# Patient Record
Sex: Female | Born: 1935 | Race: White | Hispanic: No | Smoking: Former smoker
Health system: Southern US, Community
[De-identification: ages and names within clinical notes are randomized; demographics above are authoritative.]

## PROBLEM LIST (undated history)

## (undated) DIAGNOSIS — D649 Anemia, unspecified: Secondary | ICD-10-CM

## (undated) DIAGNOSIS — T7840XA Allergy, unspecified, initial encounter: Secondary | ICD-10-CM

## (undated) DIAGNOSIS — M810 Age-related osteoporosis without current pathological fracture: Secondary | ICD-10-CM

## (undated) DIAGNOSIS — C801 Malignant (primary) neoplasm, unspecified: Secondary | ICD-10-CM

## (undated) DIAGNOSIS — F419 Anxiety disorder, unspecified: Secondary | ICD-10-CM

## (undated) DIAGNOSIS — J45909 Unspecified asthma, uncomplicated: Secondary | ICD-10-CM

## (undated) DIAGNOSIS — M1990 Unspecified osteoarthritis, unspecified site: Secondary | ICD-10-CM

## (undated) DIAGNOSIS — K219 Gastro-esophageal reflux disease without esophagitis: Secondary | ICD-10-CM

## (undated) HISTORY — PX: BREAST SURGERY: SHX581

## (undated) HISTORY — DX: Age-related osteoporosis without current pathological fracture: M81.0

## (undated) HISTORY — DX: Unspecified asthma, uncomplicated: J45.909

## (undated) HISTORY — PX: ABDOMINAL HYSTERECTOMY: SHX81

## (undated) HISTORY — DX: Gastro-esophageal reflux disease without esophagitis: K21.9

## (undated) HISTORY — DX: Allergy, unspecified, initial encounter: T78.40XA

## (undated) HISTORY — DX: Anemia, unspecified: D64.9

## (undated) HISTORY — DX: Anxiety disorder, unspecified: F41.9

## (undated) HISTORY — DX: Malignant (primary) neoplasm, unspecified: C80.1

---

## 2008-05-27 ENCOUNTER — Ambulatory Visit: Payer: Self-pay | Admitting: Radiology

## 2008-05-27 ENCOUNTER — Ambulatory Visit (HOSPITAL_BASED_OUTPATIENT_CLINIC_OR_DEPARTMENT_OTHER): Admission: RE | Admit: 2008-05-27 | Discharge: 2008-05-27 | Payer: Self-pay | Admitting: Family Medicine

## 2008-10-14 ENCOUNTER — Ambulatory Visit: Payer: Self-pay | Admitting: Diagnostic Radiology

## 2008-10-14 ENCOUNTER — Ambulatory Visit (HOSPITAL_BASED_OUTPATIENT_CLINIC_OR_DEPARTMENT_OTHER): Admission: RE | Admit: 2008-10-14 | Discharge: 2008-10-14 | Payer: Self-pay | Admitting: Internal Medicine

## 2008-12-03 ENCOUNTER — Encounter: Admission: RE | Admit: 2008-12-03 | Discharge: 2009-02-22 | Payer: Self-pay | Admitting: Orthopedic Surgery

## 2010-04-19 ENCOUNTER — Ambulatory Visit (HOSPITAL_BASED_OUTPATIENT_CLINIC_OR_DEPARTMENT_OTHER)
Admission: RE | Admit: 2010-04-19 | Discharge: 2010-04-19 | Payer: Self-pay | Source: Home / Self Care | Attending: Family Medicine | Admitting: Family Medicine

## 2013-01-10 ENCOUNTER — Emergency Department (HOSPITAL_BASED_OUTPATIENT_CLINIC_OR_DEPARTMENT_OTHER)
Admission: EM | Admit: 2013-01-10 | Discharge: 2013-01-10 | Disposition: A | Discharge status: Home / Self Care | Payer: Medicare Other | Attending: Emergency Medicine | Admitting: Emergency Medicine

## 2013-01-10 ENCOUNTER — Encounter (HOSPITAL_BASED_OUTPATIENT_CLINIC_OR_DEPARTMENT_OTHER): Payer: Self-pay | Admitting: Emergency Medicine

## 2013-01-10 ENCOUNTER — Emergency Department (HOSPITAL_BASED_OUTPATIENT_CLINIC_OR_DEPARTMENT_OTHER): Payer: Medicare Other

## 2013-01-10 DIAGNOSIS — Z79899 Other long term (current) drug therapy: Secondary | ICD-10-CM | POA: Insufficient documentation

## 2013-01-10 DIAGNOSIS — R52 Pain, unspecified: Secondary | ICD-10-CM | POA: Insufficient documentation

## 2013-01-10 DIAGNOSIS — M129 Arthropathy, unspecified: Secondary | ICD-10-CM | POA: Insufficient documentation

## 2013-01-10 DIAGNOSIS — Z87891 Personal history of nicotine dependence: Secondary | ICD-10-CM | POA: Insufficient documentation

## 2013-01-10 DIAGNOSIS — R0602 Shortness of breath: Secondary | ICD-10-CM | POA: Insufficient documentation

## 2013-01-10 DIAGNOSIS — IMO0002 Reserved for concepts with insufficient information to code with codable children: Secondary | ICD-10-CM | POA: Insufficient documentation

## 2013-01-10 DIAGNOSIS — R110 Nausea: Secondary | ICD-10-CM | POA: Insufficient documentation

## 2013-01-10 DIAGNOSIS — Z880 Allergy status to penicillin: Secondary | ICD-10-CM | POA: Insufficient documentation

## 2013-01-10 DIAGNOSIS — M25512 Pain in left shoulder: Secondary | ICD-10-CM

## 2013-01-10 DIAGNOSIS — M25519 Pain in unspecified shoulder: Secondary | ICD-10-CM | POA: Insufficient documentation

## 2013-01-10 HISTORY — DX: Unspecified osteoarthritis, unspecified site: M19.90

## 2013-01-10 MED ORDER — CYCLOBENZAPRINE HCL 10 MG PO TABS
5.0000 mg | ORAL_TABLET | Freq: Once | ORAL | Status: AC
  Administered 2013-01-10: 5 mg via ORAL
  Filled 2013-01-10: qty 1

## 2013-01-10 MED ORDER — CYCLOBENZAPRINE HCL 10 MG PO TABS: 10.0000 mg | ORAL_TABLET | Freq: Two times a day (BID) | ORAL | Status: DC | PRN

## 2013-01-10 MED ORDER — PREDNISONE 10 MG PO TABS: 20.0000 mg | ORAL_TABLET | Freq: Every day | ORAL | Status: DC

## 2013-01-10 NOTE — ED Provider Notes (Addendum)
CSN: [id number]     Arrival date & time 01/10/13  0825 History   First MD Initiated Contact with Patient 01/10/13 [phone]     Chief Complaint  Patient presents with  . Shoulder Pain  . Shortness of Breath  . Nausea   (Consider location/radiation/quality/duration/timing/severity/associated sxs/prior Treatment) HPI Comments: Patient with history of bilateral shoulder problems with rotator cuff, no recent injury or unusual activity.  Patient denies dyspnea or nausea to me.  She states like similar shoulder problems. No immunocompromise risk factors.  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  No neck pain, numbness or weakness,   Patient is a 79 y.o. female presenting with shoulder pain and shortness of breath. The history is provided by the patient.  Shoulder Pain This is a recurrent problem. The current episode started yesterday. The problem occurs constantly. The problem has not changed since onset.Pertinent negatives include no chest pain, no abdominal pain, no headaches and no shortness of breath. Exacerbated by: Movement and palpaition. Nothing relieves the symptoms. She has tried nothing for the symptoms. The treatment provided mild relief.  Shortness of Breath Associated symptoms: no abdominal pain, no chest pain and no headaches     Past Medical History  Diagnosis Date  . Arthritis    Past Surgical History  Procedure Laterality Date  . Abdominal hysterectomy     History reviewed. No pertinent family history. History  Substance Use Topics  . Smoking status: Former Games developer  . Smokeless tobacco: Not on file  . Alcohol Use: Yes     Comment: wine occ a week   OB History   Grav Para Term Preterm Abortions TAB SAB Ect Mult Living                 Review of Systems  Respiratory: Negative for shortness of breath.   Cardiovascular: Negative for chest pain.  Gastrointestinal: Negative for abdominal pain.  Neurological: Negative for headaches.    Allergies  Penicillins  Home  Medications   Current Outpatient Rx  Name  Route  Sig  Dispense  Refill  . naproxen (NAPROSYN) 500 MG tablet   Oral   Take 500 mg by mouth 2 (two) times daily with a meal.         . predniSONE (STERAPRED UNI-PAK) 10 MG tablet   Oral   Take 10 mg by mouth daily.          BP 144/90  Pulse 86  Temp(Src) 97.8 F (36.6 C) (Oral)  SpO2 97% Physical Exam  Nursing note and vitals reviewed. Constitutional: She is oriented to person, place, and time. She appears well-developed and well-nourished.  HENT:  Head: Normocephalic and atraumatic.  Right Ear: External ear normal.  Left Ear: External ear normal.  Mouth/Throat: Oropharynx is clear and moist.  Eyes: EOM are normal. Pupils are equal, round, and reactive to light.  Neck: Normal range of motion. Neck supple.  Cardiovascular: Normal rate, regular rhythm and normal heart sounds.   Pulmonary/Chest: Effort normal and breath sounds normal.  Abdominal: Soft.  Musculoskeletal:  Patient with anterior left shoulder pain with pain increased with abduction and external rotation.  No pain at elbow or neck.  No redness or warmth palpated.  Neurovascularly intact distal to injury. Left trapezius 5/5, intrinsic hand muscle strength 5/5, left elbow flexor/exte strength 5/5/. Biceps refelx 2/3.   Neurological: She is alert and oriented to person, place, and time.  Skin: Skin is warm and dry.  Psychiatric: She has a normal mood  and affect. Her behavior is normal. Judgment and thought content normal.    ED Course  Procedures (including critical care time) Labs Review Labs Reviewed - No data to display Imaging Review Dg Chest 2 View  01/10/2013   CLINICAL DATA:  Nausea and short of breath.  EXAM: CHEST  2 VIEW  COMPARISON:  None.  FINDINGS: Mild basilar atelectasis. No airspace disease. Thoracic spine DISH. The cardiopericardial silhouette is within normal limits for projection. Mediastinal contours are within normal limits as well.   IMPRESSION: No acute cardiopulmonary disease. Mild basilar atelectasis.   Electronically Signed   By: Andreas Newport M.D.   On: 01/10/2013 09:28   Dg Shoulder Left  01/10/2013   CLINICAL DATA:  Left shoulder pain since yesterday. No injury.  EXAM: LEFT SHOULDER - 2+ VIEW  COMPARISON:  None.  FINDINGS: There is partially visualized cervical spondylosis. Mild to moderate AC joint osteoarthritis is present with hypertrophy of the distal clavicle. Mild glenohumeral osteoarthritis is present with marginal osteophytes at the inferior glenohumeral joint. The shoulder is located on the scapular Y-view and on the axillary view. There is no fracture. Acromion is type 2. The visualized left chest appears within normal limits.  IMPRESSION: Mild glenohumeral and mild to moderate AC joint osteoarthritis.   Electronically Signed   By: Andreas Newport M.D.   On: 01/10/2013 09:25     Date: 01/14/2013  Rate: 76  Rhythm: normal sinus rhythm  QRS Axis: normal  Intervals: normal  ST/T Wave abnormalities: normal  Conduction Disutrbances:none  Narrative Interpretation: poor r wave progression, cannot rule out previous infarct  Old EKG Reviewed: none available    MDM  Left shoulder pain that appears localized to shoulder with reproducibiity on exam.  I do not suspect any cardiac etiology based on hsitory and exam despite nn mention of dyspnea.  EKG obtained pte shows no acute changes.  Patient is given nsaids muscle relaxants, and sling and advised close folloe up.   Hilario Quarry, MD 01/10/13 1001  Hilario Quarry, MD 01/14/13 [phone]

## 2013-01-10 NOTE — ED Notes (Signed)
Pt reports that this pain started yesterday with rad to left side of her neck.  Pt denies any recent trauma and reports nausea this AM.

## 2014-06-23 IMAGING — CR DG CHEST 2V
2 series · 2 of 2 positions shown · non-contrast
Comparison: None.

CLINICAL DATA: Nausea and short of breath.

EXAM:
CHEST  2 VIEW

[w chest pa]
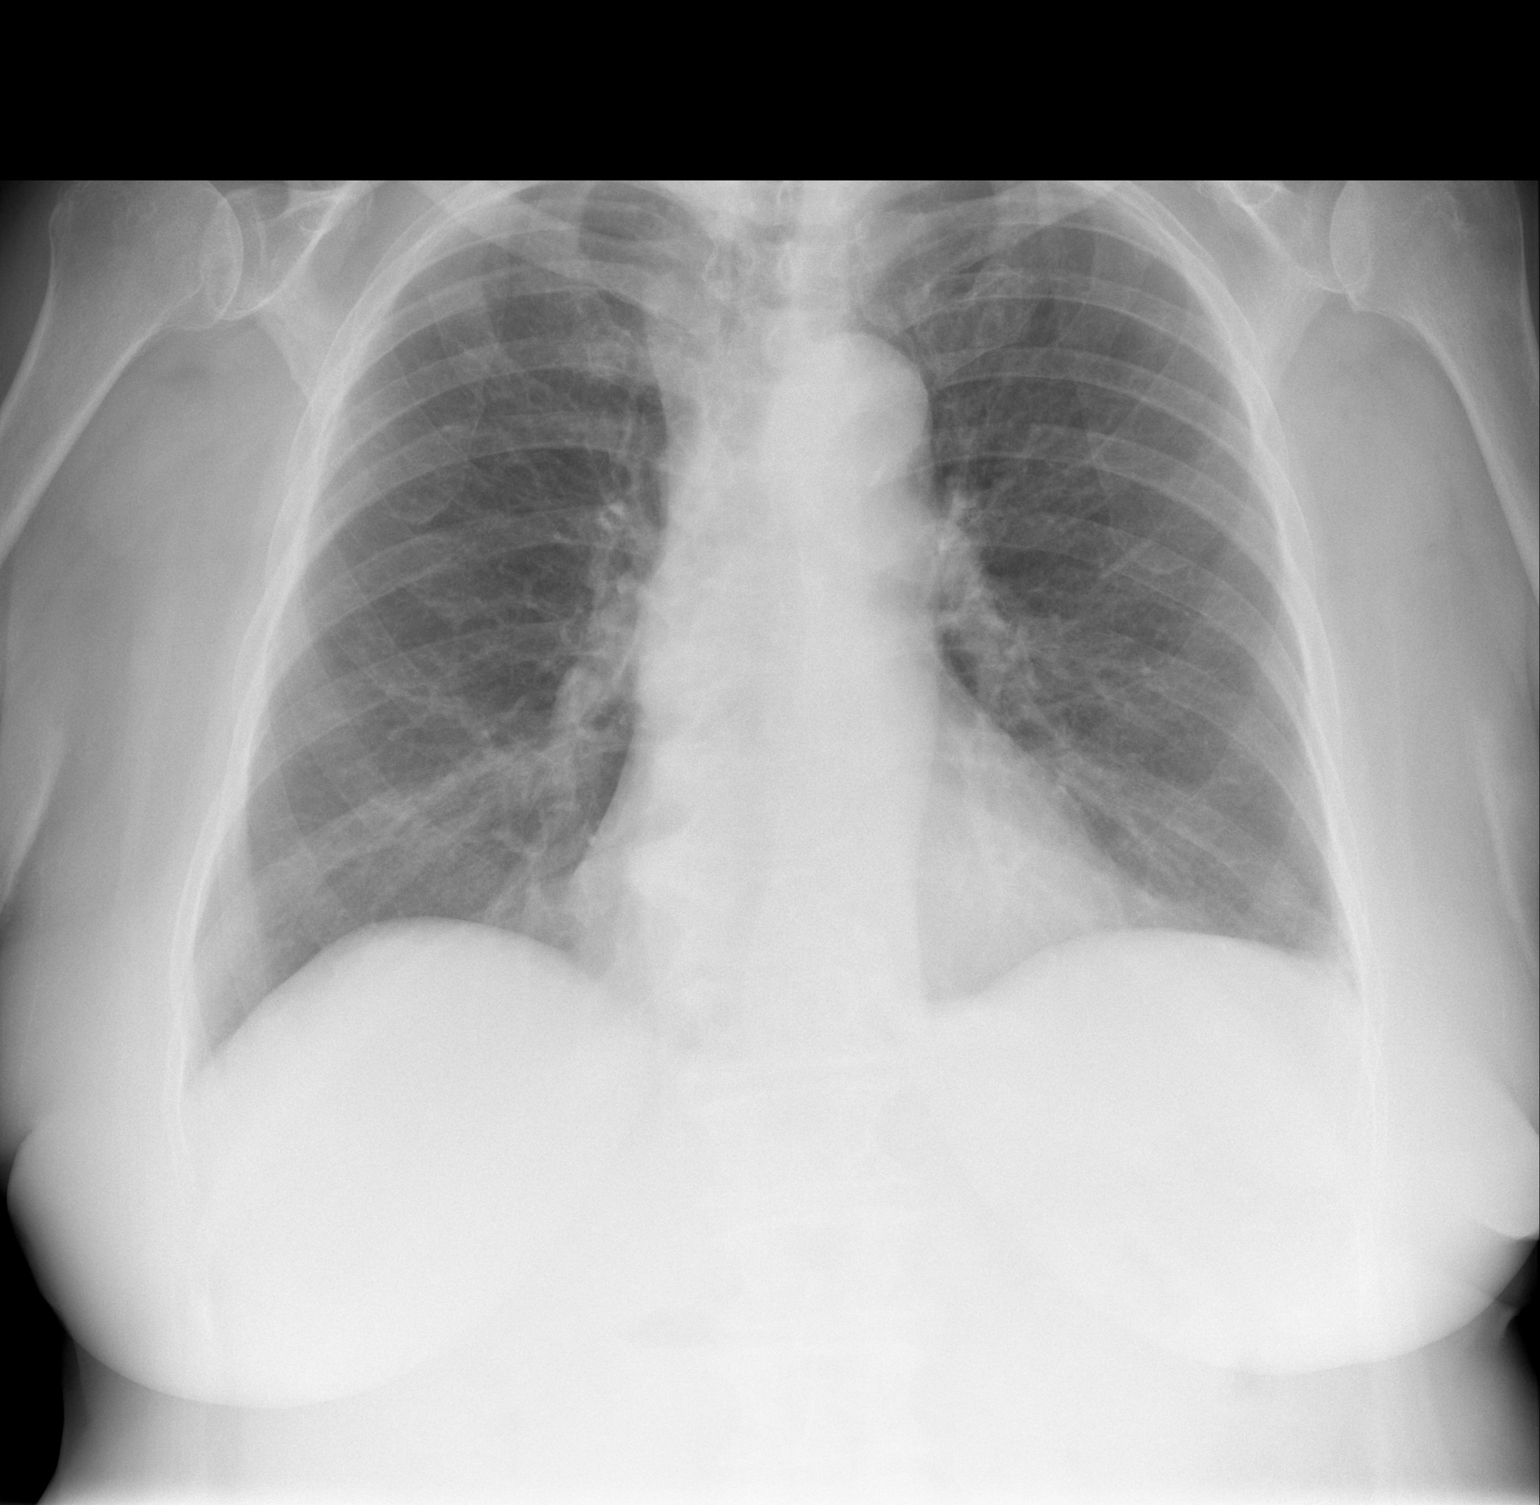

[w chest lat]
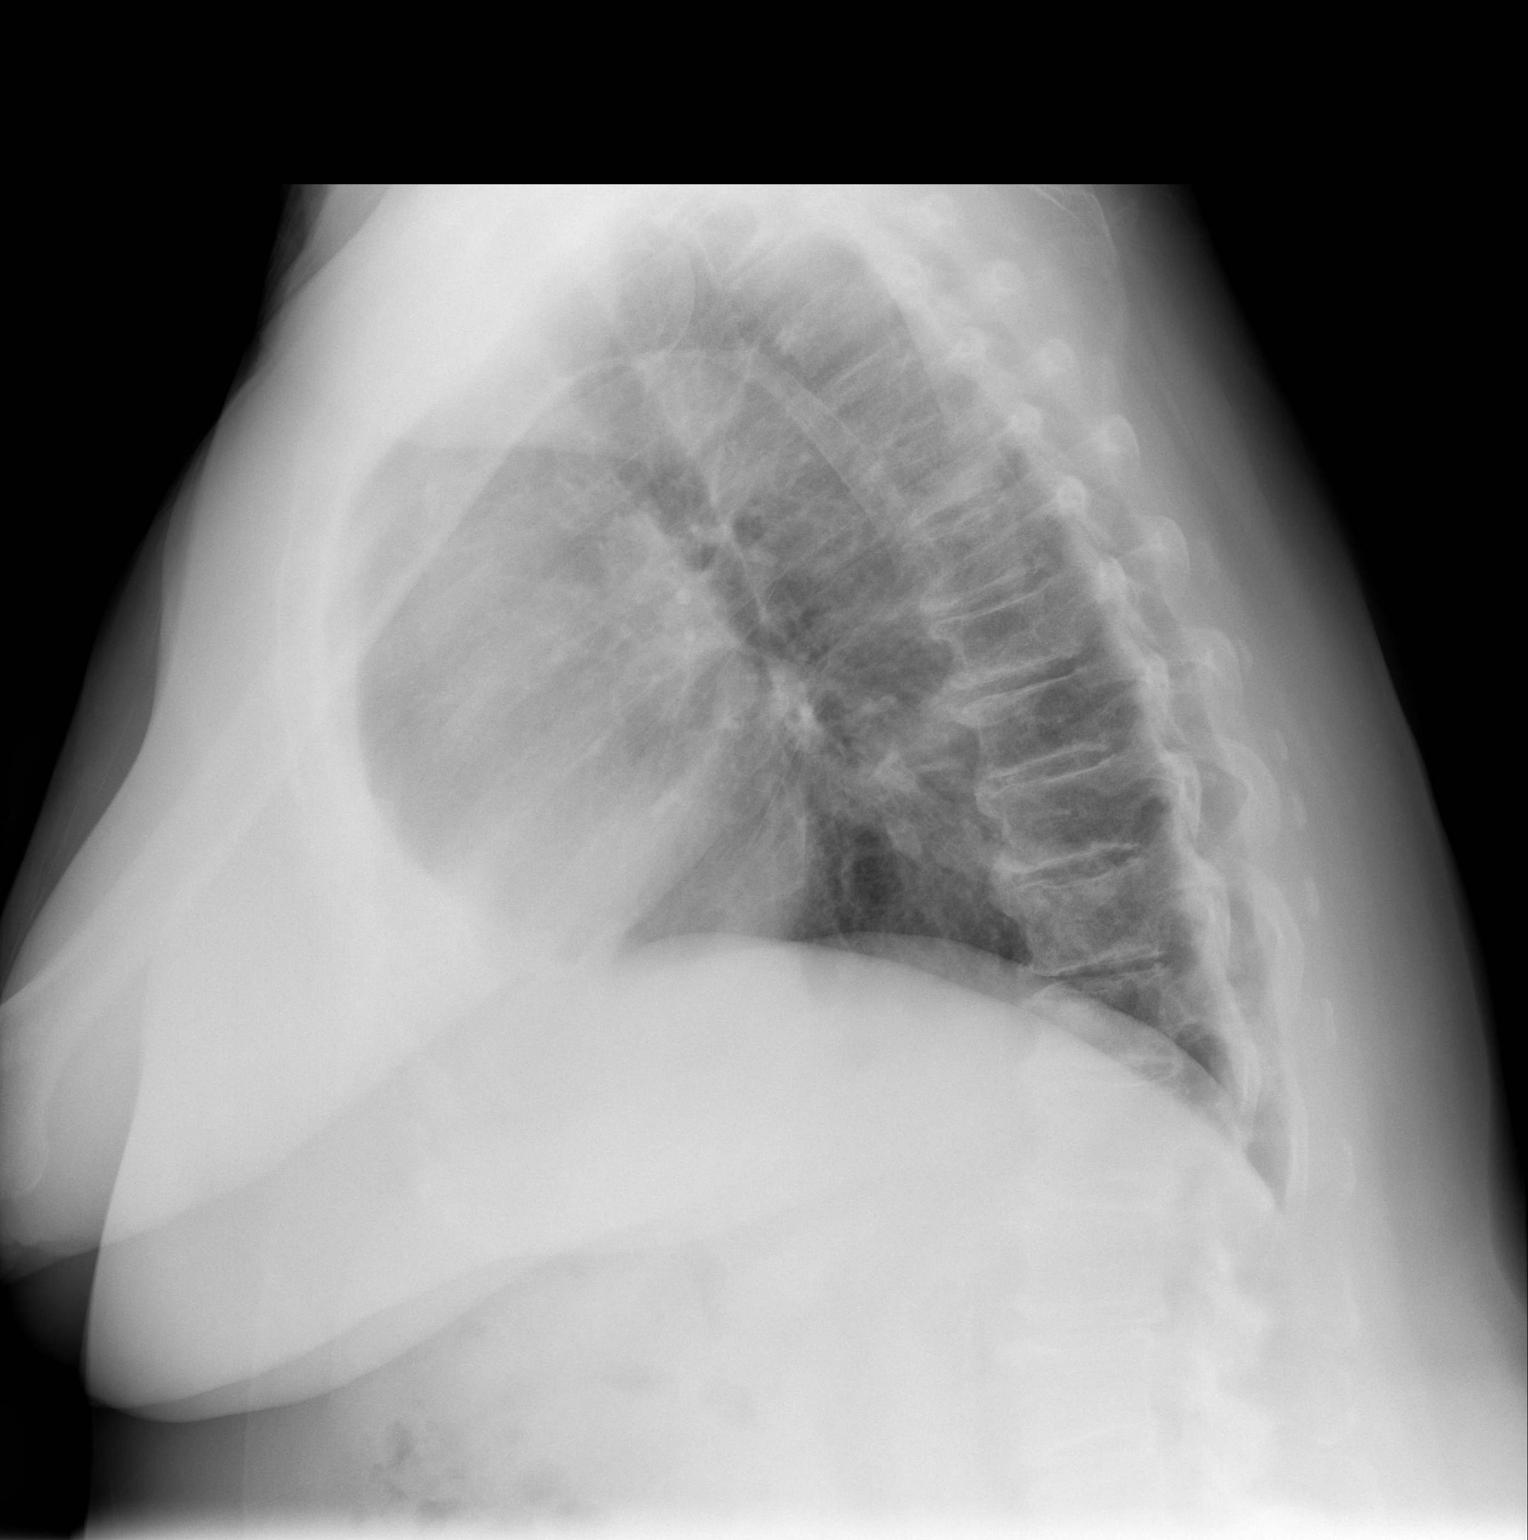

[2 of 2 positions shown; findings below may reference images not displayed]

FINDINGS: Mild basilar atelectasis. No airspace disease. Thoracic spine DISH.
The cardiopericardial silhouette is within normal limits for
projection. Mediastinal contours are within normal limits as well.
IMPRESSION: No acute cardiopulmonary disease. Mild basilar atelectasis.

## 2014-06-23 IMAGING — CR DG SHOULDER 2+V*L*
3 series · 3 of 3 positions shown · non-contrast
Comparison: None.

CLINICAL DATA: Left shoulder pain since yesterday. No injury.

EXAM:
LEFT SHOULDER - 2+ VIEW

[w shoulder ap internal left]
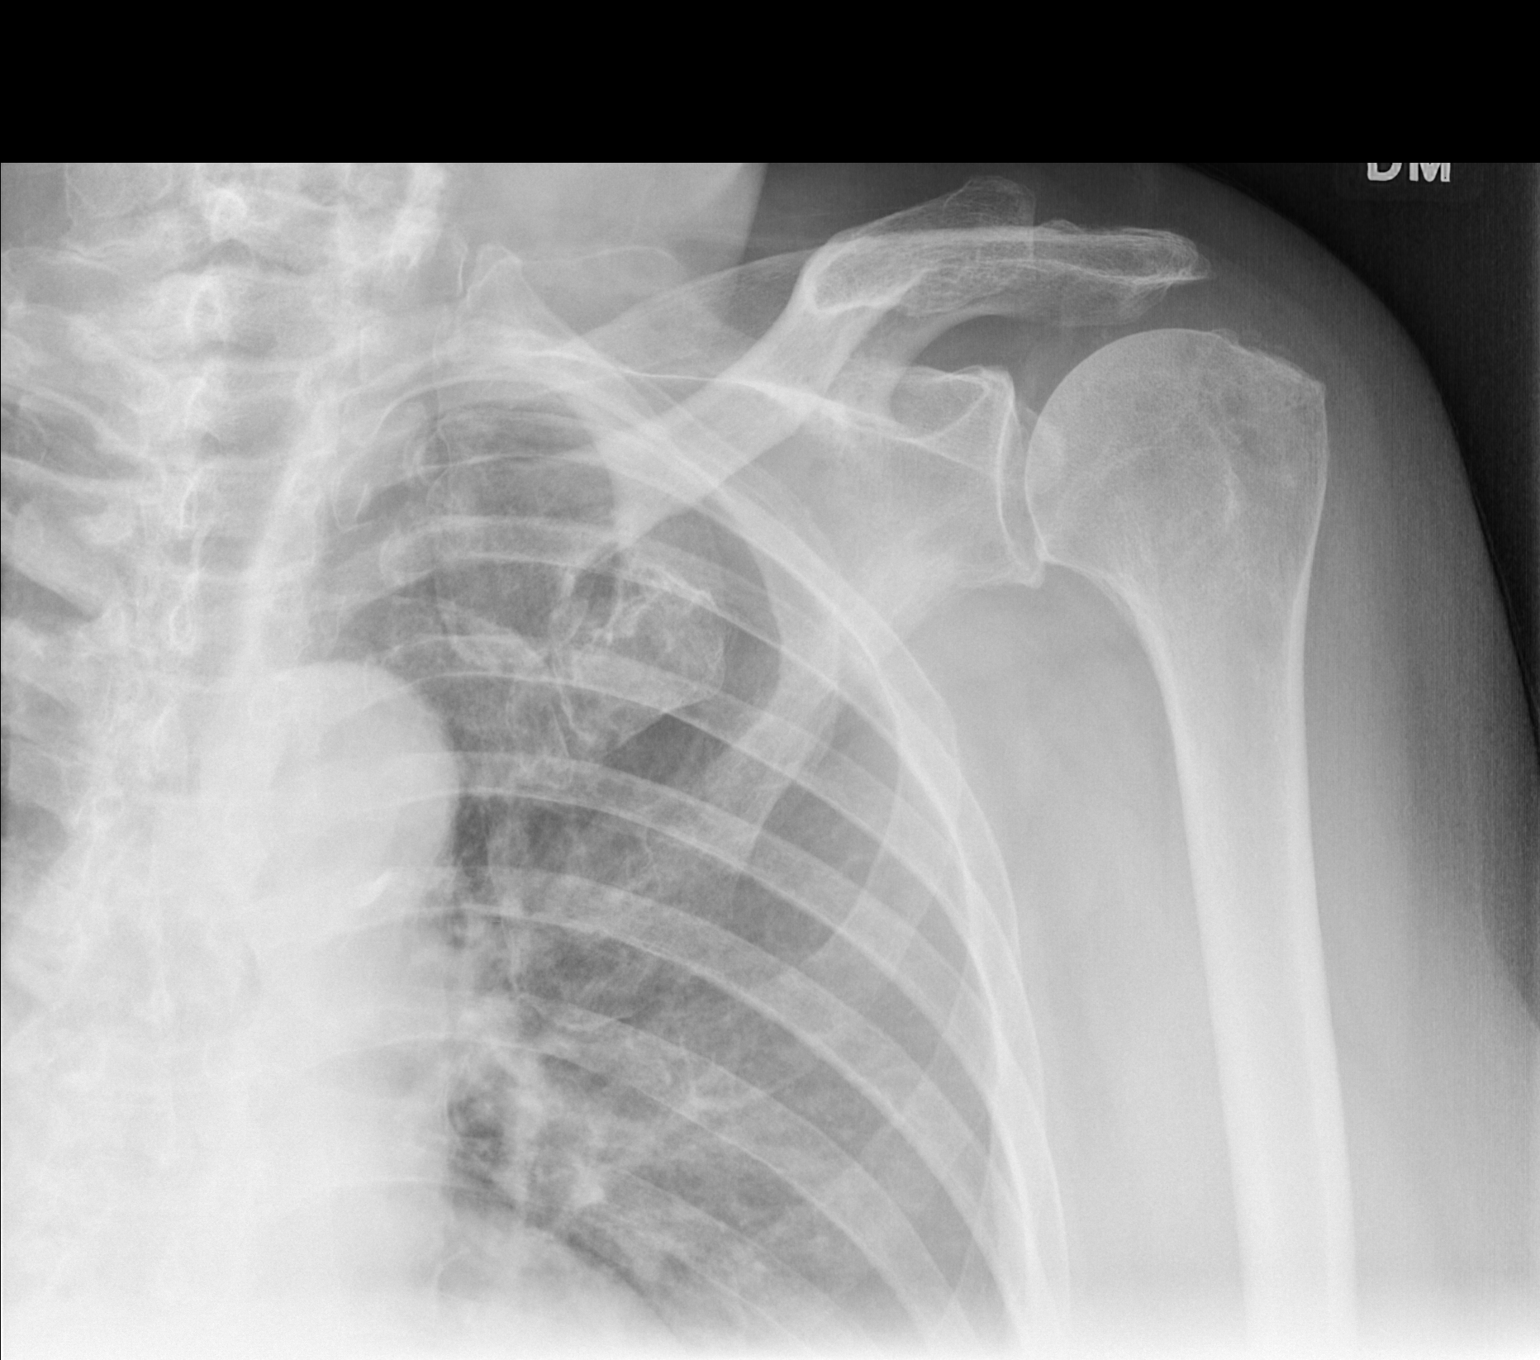

[w shoulder y view left]
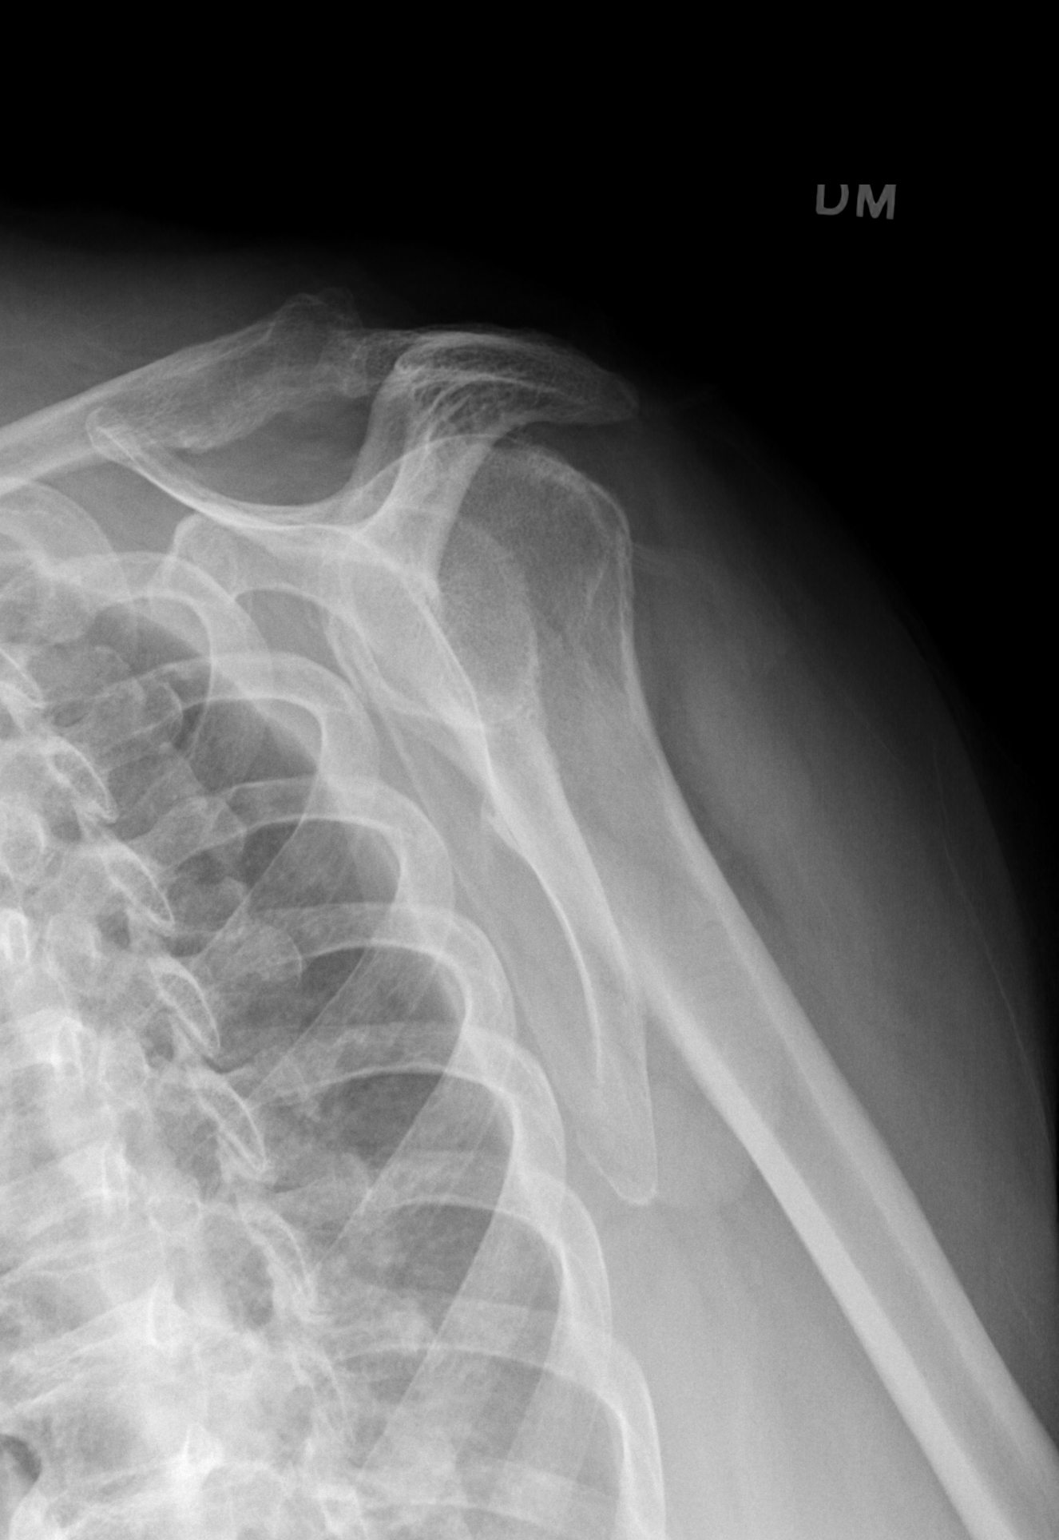

[x shoulder axillary left]
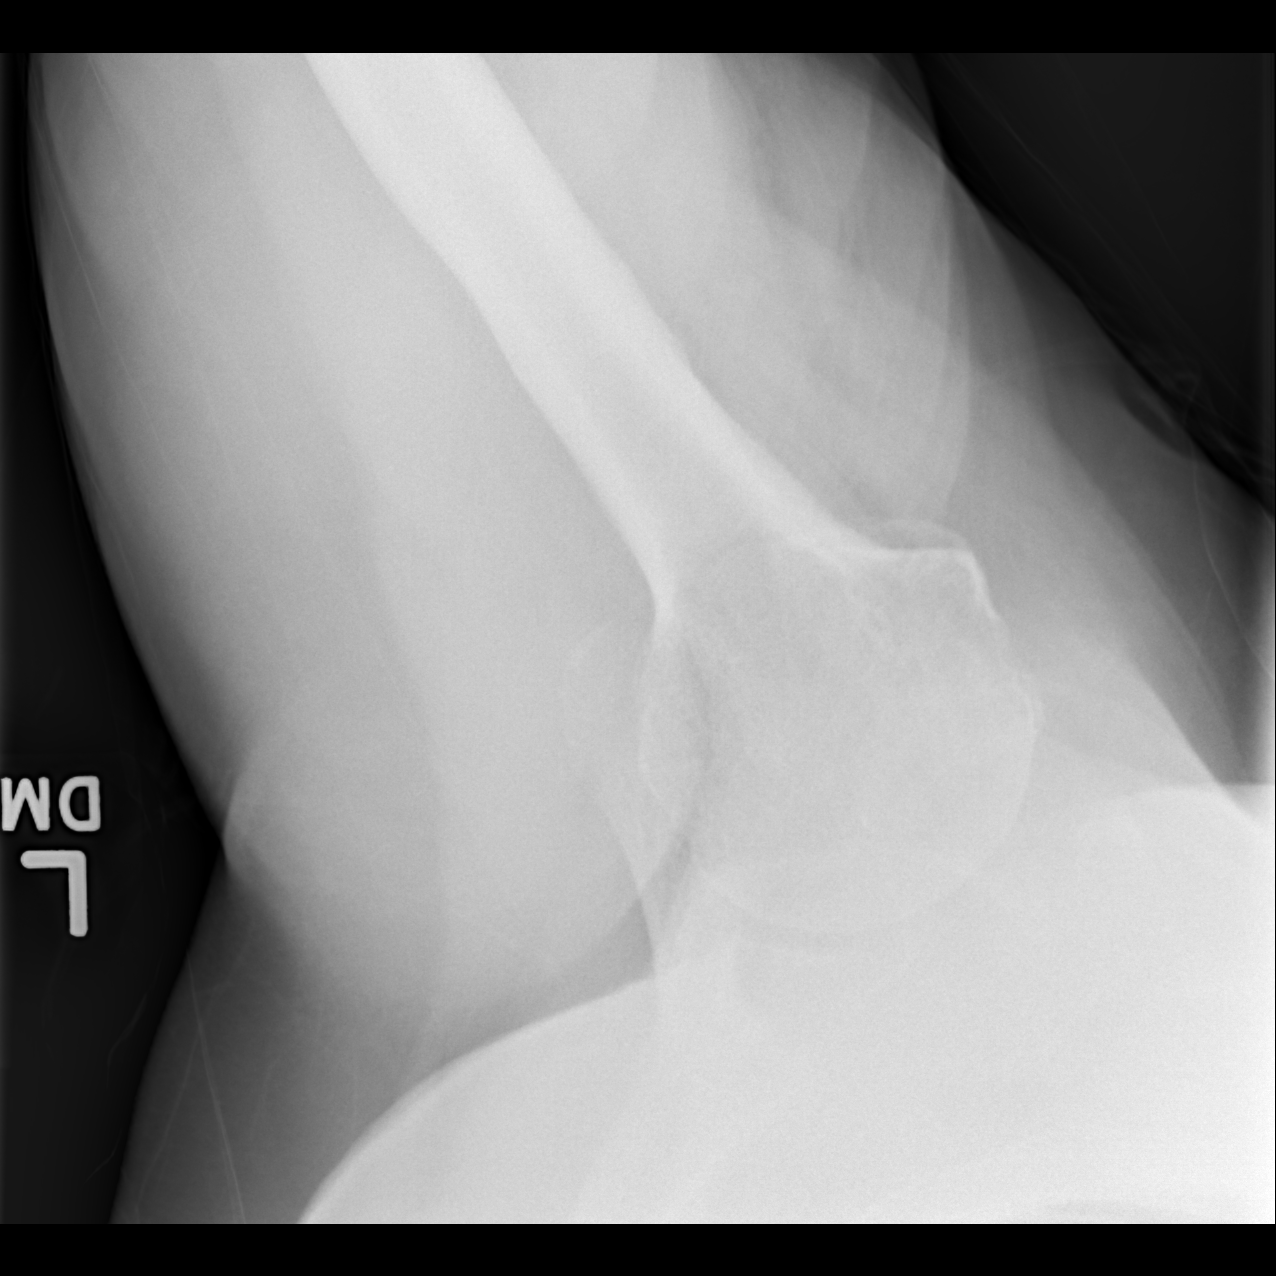

[3 of 3 positions shown; findings below may reference images not displayed]

FINDINGS: There is partially visualized cervical spondylosis. Mild to moderate
AC joint osteoarthritis is present with hypertrophy of the distal
clavicle. Mild glenohumeral osteoarthritis is present with marginal
osteophytes at the inferior glenohumeral joint. The shoulder is
located on the scapular Y-view and on the axillary view. There is no
fracture. Acromion is type 2. The visualized left chest appears
within normal limits.
IMPRESSION: Mild glenohumeral and mild to moderate AC joint osteoarthritis.

## 2021-09-13 ENCOUNTER — Encounter: Payer: Self-pay | Admitting: Family Medicine

## 2021-09-13 ENCOUNTER — Ambulatory Visit (INDEPENDENT_AMBULATORY_CARE_PROVIDER_SITE_OTHER): Payer: Medicare Other | Admitting: Family Medicine

## 2021-09-13 VITALS — BP 120/59 | HR 77 | Ht 62.0 in | Wt 150.0 lb

## 2021-09-13 DIAGNOSIS — Z79899 Other long term (current) drug therapy: Secondary | ICD-10-CM

## 2021-09-13 DIAGNOSIS — K047 Periapical abscess without sinus: Secondary | ICD-10-CM

## 2021-09-13 DIAGNOSIS — R7989 Other specified abnormal findings of blood chemistry: Secondary | ICD-10-CM

## 2021-09-13 DIAGNOSIS — R600 Localized edema: Secondary | ICD-10-CM | POA: Diagnosis not present

## 2021-09-13 DIAGNOSIS — I10 Essential (primary) hypertension: Secondary | ICD-10-CM | POA: Diagnosis not present

## 2021-09-13 DIAGNOSIS — E559 Vitamin D deficiency, unspecified: Secondary | ICD-10-CM | POA: Diagnosis not present

## 2021-09-13 DIAGNOSIS — E538 Deficiency of other specified B group vitamins: Secondary | ICD-10-CM

## 2021-09-13 LAB — COMPREHENSIVE METABOLIC PANEL
ALT: 12 U/L (ref 0–35)
AST: 20 U/L (ref 0–37)
Albumin: 3.9 g/dL (ref 3.5–5.2)
Alkaline Phosphatase: 63 U/L (ref 39–117)
BUN: 18 mg/dL (ref 6–23)
CO2: 28 mEq/L (ref 19–32)
Calcium: 10 mg/dL (ref 8.4–10.5)
Chloride: 99 mEq/L (ref 96–112)
Creatinine, Ser: 0.83 mg/dL (ref 0.40–1.20)
GFR: 62.83 mL/min (ref >60.00)
Glucose, Bld: 82 mg/dL (ref 70–99)
Potassium: 4.8 mEq/L (ref 3.5–5.1)
Sodium: 135 mEq/L (ref 135–145)
Total Bilirubin: 0.4 mg/dL (ref 0.2–1.2)
Total Protein: 6.6 g/dL (ref 6.0–8.3)

## 2021-09-13 LAB — LIPID PANEL
Cholesterol: 167 mg/dL (ref 0–200)
HDL: 74.9 mg/dL (ref >39.00)
LDL Cholesterol: 78 mg/dL (ref 0–99)
NonHDL: 92.42
Total CHOL/HDL Ratio: 2
Triglycerides: 72 mg/dL (ref 0.0–149.0)
VLDL: 14.4 mg/dL (ref 0.0–40.0)

## 2021-09-13 LAB — VITAMIN D 25 HYDROXY (VIT D DEFICIENCY, FRACTURES): VITD: 19.09 ng/mL — ABNORMAL LOW (ref 30.00–100.00)

## 2021-09-13 LAB — BRAIN NATRIURETIC PEPTIDE: Pro B Natriuretic peptide (BNP): 300 pg/mL — ABNORMAL HIGH (ref 0.0–100.0)

## 2021-09-13 LAB — CBC
HCT: 34.8 % — ABNORMAL LOW (ref 36.0–46.0)
Hemoglobin: 11.6 g/dL — ABNORMAL LOW (ref 12.0–15.0)
MCHC: 33.2 g/dL (ref 30.0–36.0)
MCV: 97.7 fl (ref 78.0–100.0)
Platelets: 303 10*3/uL (ref 150.0–400.0)
RBC: 3.56 Mil/uL — ABNORMAL LOW (ref 3.87–5.11)
RDW: 14.2 % (ref 11.5–15.5)
WBC: 5.5 10*3/uL (ref 4.0–10.5)

## 2021-09-13 LAB — VITAMIN B12: Vitamin B-12: 242 pg/mL (ref 211–911)

## 2021-09-13 LAB — TSH: TSH: 3.2 u[IU]/mL (ref 0.35–5.50)

## 2021-09-13 MED ORDER — LISINOPRIL 5 MG PO TABS: 5.0000 mg | ORAL_TABLET | Freq: Every day | ORAL | 1 refills | Status: DC

## 2021-09-13 MED ORDER — CLINDAMYCIN HCL 300 MG PO CAPS: 300.0000 mg | ORAL_CAPSULE | Freq: Three times a day (TID) | ORAL | 0 refills | Status: AC

## 2021-09-13 NOTE — Progress Notes (Addendum)
New Patient Office Visit  Subjective    Patient ID: Michele Browning, female    DOB: 07/20/1930  Age: 88 y.o. MRN: [id number]  CC: establish care  HPI Michele Browning presents to establish care. She changed PCPs d/t location preference.    Chronic lower extremity edema: Patient reports she has had bilateral lower leg swelling for the past 4+ years.  She states it was most noticeable after hip surgery.  She reports they will occasionally get red at times and she does tend to scratch/pick at them so she will have some occasional scabbing and areas.  She denies any rashes, warmth, weeping, drainage.  Per daughter she has a very inactive lifestyle, only walking maybe 20-30 steps per day as she lives in a small in-law suite attached to daughter's house.  She has not been elevating her legs and has refused wearing compression socks in the past. Patient denies any chest pain, palpitations, dyspnea, wheezing, edema, recurrent headaches, vision changes.   Dental infection: Patient reports that she has a known cracked tooth to her left upper mouth, but she is put off going to the dentist in a while due to COVID and mobility issues.  States that over the weekend, about 3 days ago, she woke up with left-sided facial swelling, redness, pain, warmth.  Her gums surrounding the cracked tooth have been red and swollen.  She refused to go to urgent care, but her daughter found some leftover clindamycin and they gave her 2 to 3 days worth of that and it did not seem to help quite a bit although the gum itself still looks quite red and is still painful.  They are working on getting her in with a dentist ASAP.  Generalized left-sided facial swelling has gone down significantly and pain is well controlled with Tylenol.  She has not noticed any drainage.   She follows with pain management for Percocet and Lyrica (chronic pain syndrome, OA of multiple joints, lumbar stenosis). This provider needs urine samples, but patient  does not feel comfortable navigating their building (as been doing primarily virtual visits), so they would like to do the urine drug screen here. They have ordered home health PT - very weak, needs complete support with ambulation/transfers (she uses wheelchair at baseline now).   Patient recently had lumpectomy for breast cancer  (L breast) and is about to start oral medications. She follows with oncology. States she will not be doing any radiation. She is due to see them soon.    Low B12 in the past - currently on OTC supplementation. Asymptomatic.    Daughter who cares for her is about to have treatment for multiple myeloma - chemo, prepping for bone marrow transplant. She is having trouble taking care of her now.  Outpatient Encounter Medications as of 09/13/2021  Medication Sig   clindamycin (CLEOCIN) 300 MG capsule Take 1 capsule (300 mg total) by mouth 3 (three) times daily for 7 days.   oxyCODONE-acetaminophen (PERCOCET) 10-325 MG tablet Take by mouth.   pregabalin (LYRICA) 75 MG capsule Take by mouth.   [DISCONTINUED] lisinopril (ZESTRIL) 5 MG tablet Take 1 tablet by mouth daily.   lisinopril (ZESTRIL) 5 MG tablet Take 1 tablet (5 mg total) by mouth daily.   naproxen (NAPROSYN) 500 MG tablet Take 500 mg by mouth 2 (two) times daily with a meal.   [DISCONTINUED] cyclobenzaprine (FLEXERIL) 10 MG tablet Take 1 tablet (10 mg total) by mouth 2 (two) times daily as needed for muscle  spasms.   [DISCONTINUED] predniSONE (DELTASONE) 10 MG tablet Take 2 tablets (20 mg total) by mouth daily.   [DISCONTINUED] predniSONE (STERAPRED UNI-PAK) 10 MG tablet Take 10 mg by mouth daily.   No facility-administered encounter medications on file as of 09/13/2021.    Past Medical History:  Diagnosis Date   Allergy    Anemia    Anxiety    Arthritis    Asthma    Cancer ([place])    GERD (gastroesophageal reflux disease)    Osteoporosis     Past Surgical History:  Procedure Laterality Date    ABDOMINAL HYSTERECTOMY     BREAST SURGERY      Family History  Problem Relation Age of Onset   Cancer Mother    Heart attack Father    Cancer Daughter     Social History   Socioeconomic History   Marital status: Divorced    Spouse name: Not on file   Number of children: Not on file   Years of education: Not on file   Highest education level: Not on file  Occupational History   Not on file  Tobacco Use   Smoking status: Former   Smokeless tobacco: Not on file  Substance and Sexual Activity   Alcohol use: Yes    Alcohol/week: 3.0 standard drinks of alcohol    Types: 3 Glasses of wine per week    Comment: wine occ a week   Drug use: No   Sexual activity: Never  Other Topics Concern   Not on file  Social History Narrative   Not on file   Social Determinants of Health   Financial Resource Strain: Not on file  Food Insecurity: Not on file  Transportation Needs: Not on file  Physical Activity: Not on file  Stress: Not on file  Social Connections: Not on file  Intimate Partner Violence: Not on file    ROS All review of systems negative except what is listed in the HPI      Objective    BP (!) 120/59   Pulse 77   Physical Exam Vitals reviewed.  Constitutional:      Appearance: Normal appearance.  HENT:     Head: Normocephalic and atraumatic.     Mouth/Throat:     Comments: Left upper mouth: cracked/decaying tooth with surrounding gum erythema and inflammation, no drainage noted Cardiovascular:     Rate and Rhythm: Normal rate and regular rhythm.  Pulmonary:     Effort: Pulmonary effort is normal.     Breath sounds: Normal breath sounds.  Musculoskeletal:     Right lower leg: Edema present.     Left lower leg: Edema present.  Skin:    Findings: No rash.  Neurological:     General: No focal deficit present.     Mental Status: She is alert and oriented to person, place, and time. Mental status is at baseline.  Psychiatric:        Mood and Affect:  Mood normal.        Behavior: Behavior normal.        Thought Content: Thought content normal.        Judgment: Judgment normal.         Assessment & Plan:   Problem List Items Addressed This Visit       Cardiovascular and Mediastinum   Primary hypertension    Blood pressure is at goal for age and co-morbidities.  I recommend continuing lisinopril.   - BP goal <130/80 -  monitor and log blood pressures at home - check around the same time each day in a relaxed setting - Limit salt to <2000 mg/day - Follow DASH eating plan (heart healthy diet) - limit alcohol to 2 standard drinks per day for men and 1 per day for women - avoid tobacco products - get at least 2 hours of regular aerobic exercise weekly Patient aware of signs/symptoms requiring further/urgent evaluation. Labs updated today.       Relevant Medications   lisinopril (ZESTRIL) 5 MG tablet   Other Relevant Orders   Brain natriuretic peptide (Completed)   CBC (Completed)   Comprehensive metabolic panel (Completed)   Lipid panel (Completed)   TSH (Completed)     Other   Bilateral lower extremity edema - Primary    Chronic condition, at baseline.  We will update labs today and check a BNP as she is not sure this is ever been monitored - no other signs of fluid overload at this time.  Recommend she wear compression socks, low-sodium diet, elevate legs whenever seated.  She is unable to stand very easily so daily weights have not been done.  Monitor skin for any signs of abrasions, infection.  Due to this chronic edema, patient has impaired mobility and would benefit from wheelchair use. She is unable to safely complete all ADLs and ambulation in the home and community independently. She is not steady with cane/walker and is at risk for falling. She has upper body mobility that would allow her to self-propel if needed as well as having family/caregivers available to assist her with transfers and wheelchair use. She would  like me to order a standard wheelchair for her.        Relevant Orders   Brain natriuretic peptide (Completed)   Comprehensive metabolic panel (Completed)   B12 deficiency    Previously under control with over-the-counter supplementation.  We will recheck labs today.  Asymptomatic at this time.      Relevant Orders   B12 (Completed)   Vitamin D deficiency    Previously well controlled.  Not currently on supplementation.  We will check labs today.      Relevant Orders   Vitamin D (25 hydroxy) (Completed)   High risk medication use    Follows with pain management for oxycodone.  UDS today at patient request to see if pain management provider will accept.      Relevant Orders   DRUG MONITORING, PANEL 8 WITH CONFIRMATION, URINE   Other Visit Diagnoses     Dental infection     Sending in 7 days of clindamycin.  She has tolerated this well in the past.  Recommend taking with probiotic to prevent stomach upset and C. difficile.  Warm salt water rinses.  Follow-up with dentist as soon as possible. Patient aware of signs/symptoms requiring further/urgent evaluation.    Relevant Medications   clindamycin (CLEOCIN) 300 MG capsule       Return in about 6 months (around 03/15/2022) for routine f/u .   Terrilyn Saver, NP

## 2021-09-13 NOTE — Assessment & Plan Note (Signed)
Previously well controlled.  Not currently on supplementation.  We will check labs today.

## 2021-09-13 NOTE — Patient Instructions (Signed)
Leg swelling - chronic issue, labs today. Please wear compression socks, elevate legs whenever you are seated. Monitor for signs of infection.   Dental infection - sending in clindamycin for 7 days. Take with probiotic to prevent stomach upset and c.diff infections.     Thank you for choosing [facility] at [facility] for your Primary Care needs. I am excited for the opportunity to partner with you to meet your health care goals. It was a pleasure meeting you today!    Information on diet, exercise, and health maintenance recommendations are listed below. This is information to help you be sure you are on track for optimal health and monitoring.   Please look over this and let us know if you have any questions or if you have completed any of the health maintenance outside of [place] so that we can be sure your records are up to date.  ___________________________________________________________  MyChart:  For all urgent or time sensitive needs we ask that you please call the office to avoid delays. Our number is [phone]. MyChart is not constantly monitored and due to the large volume of messages a day, replies may take up to 72 business hours.  MyChart Policy: MyChart allows for you to see your visit notes, after visit summary, provider recommendations, lab and tests results, make an appointment, request refills, and contact your provider or the office for non-urgent questions or concerns. Providers are seeing patients during normal business hours and do not have built in time to review MyChart messages.  We ask that you allow a minimum of 3 business days for responses to [employer]. For this reason, please do not send urgent requests through [place]. Please call the office at [phone]. New and ongoing conditions may require a visit. We have virtual and in-person visits available for your convenience.  Complex MyChart concerns may require a visit. Your  provider may request you schedule a virtual or in-person visit to ensure we are providing the best care possible. MyChart messages sent after 11:00 AM on Friday will not be received by the provider until Monday morning.    Lab and Test Results: You will receive your lab and test results on MyChart as soon as they are completed and results have been sent by the lab or testing facility. Due to this service, you will receive your results BEFORE your provider.  I review lab and test results each morning prior to seeing patients. Some results require collaboration with other providers to ensure you are receiving the most appropriate care. For this reason, we ask that you please allow a minimum of 3-5 business days from the time that ALL results have been received for your provider to receive and review lab and test results and contact you about these.  Most lab and test result comments from the provider will be sent through [facility]. Your provider may recommend changes to the plan of care, follow-up visits, repeat testing, ask questions, or request an office visit to discuss these results. You may reply directly to this message or call the office to provide information for the provider or set up an appointment. In some instances, you will be called with test results and recommendations. Please let us know if this is preferred and we will make note of this in your chart to provide this for you.    If you have not heard a response to your lab or test results in 5 business days from all results  returning to [place], please call the office to let us know. We ask that you please avoid calling prior to this time unless there is an emergent concern. Due to high call volumes, this can delay the resulting process.  After Hours: For all non-emergency after hours needs, please call the office at [phone] and select the option to reach the on-call  service. On-call services are shared between multiple [facility]  offices and therefore it will not be possible to speak directly with your provider. On-call providers may provide medical advice and recommendations, but are unable to provide refills for maintenance medications.  For all emergency or urgent medical needs after normal business hours, we recommend that you seek care at the closest Urgent Care or Emergency Department to ensure appropriate treatment in a timely manner.  [facility] has a 24 hour emergency room located on the ground floor for your convenience.   Urgent Concerns During the Business Day Providers are seeing patients from 8AM to [place] with a busy schedule and are most often not able to respond to non-urgent calls until the end of the day or the next business day. If you should have URGENT concerns during the day, please call and speak to the nurse or schedule a same day appointment so that we can address your concern without delay.   Thank you, again, for choosing me as your health care partner. I appreciate your trust and look forward to learning more about you.   [facility]cell [place]ails Olevia Bowens, DNP, FNP-C  ___________________________________________________________  Health Maintenance Recommendations Screening Testing Mammogram Every 1-2 years based on history and risk factors Starting at age 74 Pap Smear Ages 21-39 every 3 years Ages 20-65 every 5 years with HPV testing More frequent testing may be required based on results and history Colon Cancer Screening Every 1-10 years based on test performed, risk factors, and history Starting at age 46 Bone Density Screening Every 2-10 years based on history Starting at age 63 for women Recommendations for men differ based on medication usage, history, and risk factors AAA Screening One time ultrasound Men 28-82 years old who have ever smoked Lung Cancer Screening Low Dose Lung CT every 12 months Age 68-80 years with a 20 pack-year smoking history who still smoke or who  have quit within the last 15 years  Screening Labs Routine  Labs: Complete Blood Count (CBC), Complete Metabolic Panel (CMP), Cholesterol (Lipid Panel) Every 6-12 months based on history and medications May be recommended more frequently based on current conditions or previous results Hemoglobin A1c Lab Every 3-12 months based on history and previous results Starting at age 45 or earlier with diagnosis of diabetes, high cholesterol, BMI >26, and/or risk factors Frequent monitoring for patients with diabetes to ensure blood sugar control Thyroid Panel (TSH w/ T3 & T4) Every 6 months based on history, symptoms, and risk factors May be repeated more often if on medication HIV One time testing for all patients 17 and older May be repeated more frequently for patients with increased risk factors or exposure Hepatitis C One time testing for all patients 8 and older May be repeated more frequently for patients with increased risk factors or exposure Gonorrhea, Chlamydia Every 12 months for all sexually active persons 13-24 years Additional monitoring may be recommended for those who are considered high risk or who have symptoms PSA Men 51-39 years old with risk factors Additional screening may be recommended from age 41-69 based on risk factors, symptoms, and history  Vaccine Recommendations Tetanus Booster All adults every 10 years Flu Vaccine All patients 6 months and older every year COVID Vaccine All patients 12 years and older Initial dosing with booster May recommend additional booster based on age and health history HPV Vaccine 2 doses all patients age 35-26 Dosing may be considered for patients over 26 Shingles Vaccine (Shingrix) 2 doses all adults 44 years and older Pneumonia (Pneumovax 23) All adults 20 years and older May recommend earlier dosing based on health history Pneumonia (Prevnar 35) All adults 60 years and older Dosed 1 year after Pneumovax 23 Pneumonia  (Prevnar 36) All adults 80 years and older (adults 00-86 with certain conditions or risk factors) 1 dose  For those who have no received Prevnar 13 vaccine previously   Additional Screening, Testing, and Vaccinations may be recommended on an individualized basis based on family history, health history, risk factors, and/or exposure.  __________________________________________________________  Diet Recommendations for All Patients  I recommend that all patients maintain a diet low in saturated fats, carbohydrates, and cholesterol. While this can be challenging at first, it is not impossible and small changes can make big differences.  Things to try: Decreasing the amount of soda, sweet tea, and/or juice to one or less per day and replace with water While water is always the first choice, if you do not like water you may consider adding a water additive without sugar to improve the taste other sugar free drinks Replace potatoes with a brightly colored vegetable at dinner Use healthy oils, such as canola oil or olive oil, instead of butter or hard margarine Limit your bread intake to two pieces or less a day Replace regular pasta with low carb pasta options Bake, broil, or grill foods instead of frying Monitor portion sizes  Eat smaller, more frequent meals throughout the day instead of large meals  An important thing to remember is, if you love foods that are not great for your health, you don't have to give them up completely. Instead, allow these foods to be a reward when you have done well. Allowing yourself to still have special treats every once in a while is a nice way to tell yourself thank you for working hard to keep yourself healthy.   Also remember that every day is a new day. If you have a bad day and "fall off the wagon", you can still climb right back up and keep moving along on your journey!  We have resources available to help you!  Some websites that may be helpful  include: www.http://carter.biz/  Www.VeryWellFit.com _____________________________________________________________  Activity Recommendations for All Patients  I recommend that all adults get at least 20 minutes of moderate physical activity that elevates your heart rate at least 5 days out of the week.  Some examples include: Walking or jogging at a pace that allows you to carry on a conversation Cycling (stationary bike or outdoors) Water aerobics Yoga Weight lifting Dancing If physical limitations prevent you from putting stress on your joints, exercise in a pool or seated in a chair are excellent options.  Do determine your MAXIMUM heart rate for activity: 220 - YOUR AGE = MAX Heart Rate   Remember! Do not push yourself too hard.  Start slowly and build up your pace, speed, weight, time in exercise, etc.  Allow your body to rest between exercise and get good sleep. You will need more water than normal when you are exerting yourself. Do not wait until you are thirsty to  drink. Drink with a purpose of getting in at least 8, 8 ounce glasses of water a day plus more depending on how much you exercise and sweat.    If you begin to develop dizziness, chest pain, abdominal pain, jaw pain, shortness of breath, headache, vision changes, lightheadedness, or other concerning symptoms, stop the activity and allow your body to rest. If your symptoms are severe, seek emergency evaluation immediately. If your symptoms are concerning, but not severe, please let us know so that we can recommend further evaluation.

## 2021-09-13 NOTE — Assessment & Plan Note (Signed)
Previously under control with over-the-counter supplementation.  We will recheck labs today.  Asymptomatic at this time.

## 2021-09-13 NOTE — Assessment & Plan Note (Signed)
Follows with pain management for oxycodone.  UDS today at patient request to see if pain management provider will accept.

## 2021-09-13 NOTE — Assessment & Plan Note (Signed)
Blood pressure is at goal for age and co-morbidities.  I recommend continuing lisinopril.   - BP goal <130/80 - monitor and log blood pressures at home - check around the same time each day in a relaxed setting - Limit salt to <2000 mg/day - Follow DASH eating plan (heart healthy diet) - limit alcohol to 2 standard drinks per day for men and 1 per day for women - avoid tobacco products - get at least 2 hours of regular aerobic exercise weekly Patient aware of signs/symptoms requiring further/urgent evaluation. Labs updated today.

## 2021-09-13 NOTE — Assessment & Plan Note (Signed)
Chronic condition, at baseline.  We will update labs today and check a BNP as she is not sure this is ever been monitored - no other signs of fluid overload at this time.  Recommend she wear compression socks, low-sodium diet, elevate legs whenever seated.  She is unable to stand very easily so daily weights have not been done.  Monitor skin for any signs of abrasions, infection.

## 2021-09-14 MED ORDER — FUROSEMIDE 40 MG PO TABS: 40.0000 mg | ORAL_TABLET | Freq: Every day | ORAL | 0 refills | Status: AC

## 2021-09-14 NOTE — Progress Notes (Signed)
-   Your Vitamin D level is low. Recommend you start taking 1,000 units over-the-counter daily. If already taking a supplement, increase your dose by 1,000 units daily.  - Your BNP (fluid marker) is elevated so I am going to give you some lasix to help get some fluid off. Thankfully your kidney function is stable. Continue the supportive measures we discussed - elevation, compression socks, avoiding sodium in your diet. Recommend you follow-up early next week so we can see if we are making progress and also recheck your kidney function. If you are able, it would be very helpful if you could start weighing yourself every days and keeping a log (same time of day with same amount of clothing each time; bring list to appointment next week - preferably Monday/Tuesday). You can see whoever has an opening - please call office to schedule. I am also ordering an ultrasound of your heart to check for causes of the fluid accumulation.

## 2021-09-14 NOTE — Addendum Note (Signed)
Addended by: Caleen Jobs B on: 09/14/2021 08:44 AM   Modules accepted: Orders

## 2021-09-16 LAB — DRUG MONITORING, PANEL 8 WITH CONFIRMATION, URINE
6 Acetylmorphine: NEGATIVE ng/mL (ref <10)
Alcohol Metabolites: NEGATIVE ng/mL (ref <500)
Amphetamines: NEGATIVE ng/mL (ref <500)
Benzodiazepines: NEGATIVE ng/mL (ref <100)
Buprenorphine, Urine: NEGATIVE ng/mL (ref <5)
Cocaine Metabolite: NEGATIVE ng/mL (ref <150)
Codeine: NEGATIVE ng/mL (ref <50)
Creatinine: 76.3 mg/dL (ref >=20.0)
Hydrocodone: NEGATIVE ng/mL (ref <50)
Hydromorphone: NEGATIVE ng/mL (ref <50)
MDMA: NEGATIVE ng/mL (ref <500)
Marijuana Metabolite: NEGATIVE ng/mL (ref <20)
Morphine: NEGATIVE ng/mL (ref <50)
Norhydrocodone: NEGATIVE ng/mL (ref <50)
Noroxycodone: >10000 ng/mL — ABNORMAL HIGH (ref <50)
Opiates: NEGATIVE ng/mL (ref <100)
Oxidant: NEGATIVE ug/mL (ref <200)
Oxycodone: 3778 ng/mL — ABNORMAL HIGH (ref <50)
Oxycodone: POSITIVE ng/mL — AB (ref <100)
Oxymorphone: 1884 ng/mL — ABNORMAL HIGH (ref <50)
pH: 5.8 (ref 4.5–9.0)

## 2021-09-16 LAB — DM TEMPLATE

## 2021-09-21 ENCOUNTER — Encounter: Payer: Self-pay | Admitting: *Deleted

## 2021-09-26 ENCOUNTER — Telehealth: Payer: Self-pay | Admitting: Family Medicine

## 2021-09-27 ENCOUNTER — Telehealth: Payer: Self-pay | Admitting: Family Medicine

## 2021-09-27 ENCOUNTER — Ambulatory Visit: Payer: Medicare Other | Admitting: Family Medicine

## 2021-09-27 DIAGNOSIS — M1990 Unspecified osteoarthritis, unspecified site: Secondary | ICD-10-CM

## 2021-09-27 DIAGNOSIS — R600 Localized edema: Secondary | ICD-10-CM

## 2021-09-29 NOTE — Telephone Encounter (Signed)
Patient would like an update regarding the wheelchair rx, and she would like a call back to discuss what she needs to do next. Please advise.

## 2021-09-30 NOTE — Telephone Encounter (Signed)
LMOM for pt to return call to let us know what DME company she would like for [place] to send the order to.

## 2021-10-03 NOTE — Telephone Encounter (Signed)
Pt called back. Please advise.

## 2021-10-03 NOTE — Telephone Encounter (Signed)
Called pt back and she doesn't know the name of the company so she will call back with that info.

## 2021-10-05 NOTE — Telephone Encounter (Signed)
Pt called back with location.  It is [facility] [employer] on [address].  Ph # [phone]

## 2021-10-07 NOTE — Telephone Encounter (Signed)
Order faxed to [place]. F # [phone]

## 2021-10-10 ENCOUNTER — Ambulatory Visit (HOSPITAL_BASED_OUTPATIENT_CLINIC_OR_DEPARTMENT_OTHER): Payer: Medicare Other

## 2021-10-11 ENCOUNTER — Other Ambulatory Visit (HOSPITAL_BASED_OUTPATIENT_CLINIC_OR_DEPARTMENT_OTHER): Payer: Medicare Other

## 2021-10-12 NOTE — Telephone Encounter (Signed)
Pt states wheelchair company is requesting face to face narrative, she was not sure if that is ov notes and a diagnosis as to why she needs this. Please advise.

## 2021-10-12 NOTE — Telephone Encounter (Signed)
Last OV notes faxed

## 2021-10-18 ENCOUNTER — Ambulatory Visit (HOSPITAL_BASED_OUTPATIENT_CLINIC_OR_DEPARTMENT_OTHER): Admission: RE | Admit: 2021-10-18 | Payer: Medicare Other | Source: Ambulatory Visit

## 2022-03-09 ENCOUNTER — Other Ambulatory Visit: Payer: Self-pay | Admitting: Family Medicine

## 2022-03-09 DIAGNOSIS — I10 Essential (primary) hypertension: Secondary | ICD-10-CM

## 2022-04-08 ENCOUNTER — Other Ambulatory Visit: Payer: Self-pay | Admitting: Family Medicine

## 2022-04-08 DIAGNOSIS — I10 Essential (primary) hypertension: Secondary | ICD-10-CM

## 2022-04-10 ENCOUNTER — Encounter: Payer: Self-pay | Admitting: *Deleted

## 2022-04-21 ENCOUNTER — Telehealth: Payer: Self-pay

## 2022-04-21 NOTE — Telephone Encounter (Signed)
Transition Care Management Unsuccessful Follow-up Telephone Call  Date of discharge and from where:  [facility] 04/20/2022  Attempts:  1st Attempt  Reason for unsuccessful TCM follow-up call:  Left voice message Juanda Crumble, [place] Direct Dial [phone]

## 2022-04-24 NOTE — Telephone Encounter (Signed)
Transition Care Management Unsuccessful Follow-up Telephone Call  Date of discharge and from where:  [facility] 04/20/2022  Attempts:  2nd Attempt  Reason for unsuccessful TCM follow-up call:  Left voice message Juanda Crumble, [place] Direct Dial [phone]

## 2022-04-27 NOTE — Telephone Encounter (Signed)
Transition Care Management Unsuccessful Follow-up Telephone Call  Date of discharge and from where:  [facility] 04/20/2022  Attempts:  3rd Attempt  Reason for unsuccessful TCM follow-up call:  Left voice message Juanda Crumble, [place] Direct Dial [phone]

## 2022-05-20 ENCOUNTER — Other Ambulatory Visit: Payer: Self-pay | Admitting: Family Medicine

## 2022-05-20 DIAGNOSIS — I10 Essential (primary) hypertension: Secondary | ICD-10-CM

## 2022-11-17 ENCOUNTER — Other Ambulatory Visit: Payer: Self-pay | Admitting: Family Medicine

## 2022-11-17 DIAGNOSIS — I10 Essential (primary) hypertension: Secondary | ICD-10-CM

## 2022-11-27 ENCOUNTER — Telehealth: Payer: Self-pay | Admitting: Family Medicine

## 2022-11-27 NOTE — Telephone Encounter (Signed)
Copied from CRM [id number]. Topic: Medicare AWV >> Nov 27, 2022 10:45 AM Payton Doughty wrote: Reason for CRM: Called 11/27/2022 to sched AWV - NO VOICEMAIL  Verlee Rossetti; Care Guide Ambulatory Clinical Support [place] [facility] Direct Dial: [phone]

## 2023-12-03 DEATH — deceased
